# Patient Record
Sex: Male | Born: 1980 | Race: White | Hispanic: No | Marital: Married | Smoking: Never smoker
Health system: Southern US, Community
[De-identification: ages and names within clinical notes are randomized; demographics above are authoritative.]

## PROBLEM LIST (undated history)

## (undated) DIAGNOSIS — E7800 Pure hypercholesterolemia, unspecified: Secondary | ICD-10-CM

---

## 2021-03-08 ENCOUNTER — Other Ambulatory Visit: Payer: Self-pay

## 2021-03-08 ENCOUNTER — Emergency Department (HOSPITAL_BASED_OUTPATIENT_CLINIC_OR_DEPARTMENT_OTHER)
Admission: EM | Admit: 2021-03-08 | Discharge: 2021-03-08 | Disposition: A | Discharge status: Home / Self Care | Payer: BC Managed Care – PPO | Attending: Emergency Medicine | Admitting: Emergency Medicine

## 2021-03-08 ENCOUNTER — Encounter (HOSPITAL_BASED_OUTPATIENT_CLINIC_OR_DEPARTMENT_OTHER): Payer: Self-pay | Admitting: *Deleted

## 2021-03-08 DIAGNOSIS — Y92090 Kitchen in other non-institutional residence as the place of occurrence of the external cause: Secondary | ICD-10-CM | POA: Diagnosis not present

## 2021-03-08 DIAGNOSIS — S61211A Laceration without foreign body of left index finger without damage to nail, initial encounter: Secondary | ICD-10-CM

## 2021-03-08 DIAGNOSIS — W260XXA Contact with knife, initial encounter: Secondary | ICD-10-CM | POA: Diagnosis not present

## 2021-03-08 DIAGNOSIS — S6992XA Unspecified injury of left wrist, hand and finger(s), initial encounter: Secondary | ICD-10-CM | POA: Diagnosis present

## 2021-03-08 HISTORY — DX: Pure hypercholesterolemia, unspecified: E78.00

## 2021-03-08 MED ORDER — ACETAMINOPHEN 500 MG PO TABS
1000.0000 mg | ORAL_TABLET | Freq: Once | ORAL | Status: AC
  Administered 2021-03-08: 1000 mg via ORAL
  Filled 2021-03-08: qty 2

## 2021-03-08 MED ORDER — LIDOCAINE HCL (PF) 1 % IJ SOLN
5.0000 mL | Freq: Once | INTRAMUSCULAR | Status: AC
  Administered 2021-03-08: 5 mL
  Filled 2021-03-08: qty 5

## 2021-03-08 NOTE — ED Triage Notes (Signed)
C/o left index finger  lac by kitchen knife x  20 mins ago

## 2021-03-08 NOTE — Discharge Instructions (Addendum)
I have placed 3 stitches to your left index finger, please have these removed within 7 to 10 days.  You may apply bacitracin or Neosporin to the wound after today.  You may alternate Tylenol or Ibuprofen to help with the pain.

## 2021-03-08 NOTE — ED Notes (Signed)
Pt soaking hand at this time

## 2021-03-08 NOTE — ED Provider Notes (Signed)
[facility] EMERGENCY DEPARTMENT Provider Note   CSN: [id number] Arrival date & time: George  Spencer     History Chief Complaint  Patient presents with  . Extremity Laceration    George Spencer is a 40 y.o. male.  40 y.o male with no PMH presents to the ED with a chief complaint of left index finger laceration x 1 hour ago.  Patient reports he was cutting with a kitchen knife when he suddenly slipped the tip of his left index finger.,  He immediately washed this.  Applied a dry bandage to it along with wrapped it with gauze.  Reports bleeding was controlled at the time.  Last tetanus immunization was about 1 year ago.  There is no other injury or complaint.  The history is provided by the patient and the spouse.       Past Medical History:  Diagnosis Date  . Hypercholesteremia     There are no problems to display for this patient.   History reviewed. No pertinent surgical history.     No family history on file.  Social History   Tobacco Use  . Smoking status: Never Smoker  Substance Use Topics  . Alcohol use: Yes    Home Medications Prior to Admission medications   Not on File    Allergies    Aspirin and Nitazoxanide  Review of Systems   Review of Systems  Constitutional: Negative for fever.  Skin: Positive for wound.    Physical Exam Updated Vital Signs BP (!) 145/98 (BP Location: Left Arm)   Pulse 89   Temp 98.7 F (37.1 C) (Oral)   Resp 18   Ht 5\' 8"  (1.727 m)   Wt 65.8 kg   SpO2 98%   BMI 22.05 kg/m   Physical Exam Vitals and nursing note reviewed.  Constitutional:      Appearance: Normal appearance.  HENT:     Head: Normocephalic and atraumatic.     Nose: Nose normal.     Mouth/Throat:     Mouth: Mucous membranes are moist.  Cardiovascular:     Rate and Rhythm: Normal rate.  Pulmonary:     Effort: Pulmonary effort is normal.  Abdominal:     General: Abdomen is flat.  Musculoskeletal:     Left hand: Laceration  and tenderness present. No swelling, deformity or bony tenderness. Normal range of motion. Normal strength. Normal sensation. There is no disruption of two-point discrimination. Normal capillary refill. Normal pulse.     Cervical back: Normal range of motion and neck supple.     Comments: 1 cm laceration to the distal aspect of the left index finger.  Pulses present, pain with flexion and extension, actively bleeding.  Skin:    General: Skin is warm and dry.     Findings: Erythema present.  Neurological:     Mental Status: He is alert and oriented to person, place, and time.     ED Results / Procedures / Treatments   Labs (all labs ordered are listed, but only abnormal results are displayed) Labs Reviewed - No data to display  EKG None  Radiology No results found.  Procedures . Laceration Repair  Date/Time: 03/08/2021 8:14 PM Performed by: 03/10/2021, PA-C Authorized by: Claude Manges, PA-C   Consent:    Consent obtained:  Verbal   Consent given by:  Patient   Risks discussed:  Infection and pain   Alternatives discussed:  No treatment Anesthesia:    Anesthesia method:  Local infiltration  Local anesthetic:  Lidocaine 1% w/o epi Laceration details:    Location:  Finger   Finger location:  L index finger   Length (cm):  1   Depth (mm):  1 Exploration:    Hemostasis achieved with:  Direct pressure Treatment:    Area cleansed with:  Saline   Amount of cleaning:  Extensive Skin repair:    Repair method:  Sutures   Suture size:  4-0   Suture material:  Prolene   Suture technique:  Simple interrupted   Number of sutures:  3 Approximation:    Approximation:  Close Repair type:    Repair type:  Simple Post-procedure details:    Dressing:  Bulky dressing   Procedure completion:  Tolerated well, no immediate complications     Medications Ordered in ED Medications  lidocaine (PF) (XYLOCAINE) 1 % injection 5 mL (5 mLs Infiltration Given by Other George 1948)   acetaminophen (TYLENOL) tablet 1,000 mg (1,000 mg Oral Given George 1947)    ED Course  I have reviewed the triage vital signs and the nursing notes.  Pertinent labs & imaging results that were available during my care of the patient were reviewed by me and considered in my medical decision making (see chart for details).    MDM Rules/Calculators/A&P     Patient here for left index laceration status post cutting ham 45 minutes prior to arrival into the ED.  Actively bleeding during the time of the evaluation.  There is more pain with flexion along with palpation of the DIP on the left index finger.  No damage to the nail appreciated, laceration is about 1 cm.  Tetanus immunization is up-to-date.  Lidocaine was used to numb the area, 3 stitches were applied, finger was placed in a bulky dressing.  He was given Tylenol to help with symptomatic control.  He is to follow-up with PCP in order to have stitches removed within 7 to 10 days.  Returns precautions discussed at length, patient stable for discharge.  Portions of this note were generated with Scientist, clinical (histocompatibility and immunogenetics). Dictation errors may occur despite best attempts at proofreading.  Final Clinical Impression(s) / ED Diagnoses Final diagnoses:  Laceration of left index finger without foreign body without damage to nail, initial encounter    Rx / DC Orders ED Discharge Orders    None       Claude Manges, Cordelia Poche 03/08/21 2019    Charlynne Pander, MD 03/08/21 2118

## 2021-08-26 ENCOUNTER — Emergency Department (HOSPITAL_COMMUNITY)
Admission: EM | Admit: 2021-08-26 | Discharge: 2021-08-26 | Disposition: A | Discharge status: Home / Self Care | Payer: BC Managed Care – PPO | Attending: Emergency Medicine | Admitting: Emergency Medicine

## 2021-08-26 ENCOUNTER — Encounter (HOSPITAL_COMMUNITY): Payer: Self-pay | Admitting: Emergency Medicine

## 2021-08-26 ENCOUNTER — Emergency Department (HOSPITAL_BASED_OUTPATIENT_CLINIC_OR_DEPARTMENT_OTHER): Payer: BC Managed Care – PPO

## 2021-08-26 ENCOUNTER — Other Ambulatory Visit: Payer: Self-pay

## 2021-08-26 DIAGNOSIS — M79661 Pain in right lower leg: Secondary | ICD-10-CM | POA: Diagnosis not present

## 2021-08-26 DIAGNOSIS — R0989 Other specified symptoms and signs involving the circulatory and respiratory systems: Secondary | ICD-10-CM

## 2021-08-26 DIAGNOSIS — M79604 Pain in right leg: Secondary | ICD-10-CM | POA: Diagnosis present

## 2021-08-26 NOTE — ED Provider Notes (Addendum)
[facility] EMERGENCY DEPARTMENT Provider Note   CSN: [id number] Arrival date & time: 08/26/21  1231     History Chief Complaint  Patient presents with   right leg pain   vein symptom    George Spencer is a 40 y.o. male.  Patient indicates in past few months, sense of noticing veins in right leg being more prominent at times, and intermittent with pain to inside of right lower leg. Symptoms occur at rest, mild, intermittent, without specific exacerbating or alleviating factors. No claudication. No associated numbness/weakness. No swelling or redness to leg. No hx dvt or pe. No chest pain or sob. No fevers.   The history is provided by the patient and medical records.      Past Medical History:  Diagnosis Date   Hypercholesteremia     There are no problems to display for this patient.   History reviewed. No pertinent surgical history.     History reviewed. No pertinent family history.  Social History   Tobacco Use   Smoking status: Never   Smokeless tobacco: Never  Substance Use Topics   Alcohol use: Yes   Drug use: Not Currently    Home Medications Prior to Admission medications   Not on File    Allergies    Aspirin and Nitazoxanide  Review of Systems   Review of Systems  Constitutional:  Negative for fever.  HENT:  Negative for sore throat.   Eyes:  Negative for redness.  Respiratory:  Negative for shortness of breath.   Cardiovascular:  Negative for chest pain.  Gastrointestinal:  Negative for abdominal pain.  Genitourinary:  Negative for flank pain.  Musculoskeletal:  Negative for back pain.  Skin:  Negative for rash.  Neurological:  Negative for weakness, numbness and headaches.  Hematological:  Does not bruise/bleed easily.  Psychiatric/Behavioral:  Negative for confusion.    Physical Exam Updated Vital Signs BP 137/84 (BP Location: Left Arm)   Pulse 75   Temp 99.1 F (37.3 C) (Oral)   Resp 17   Ht 1.727 m (5\' 8" )    Wt 66.7 kg   SpO2 99%   BMI 22.35 kg/m   Physical Exam Vitals and nursing note reviewed.  Constitutional:      Appearance: Normal appearance. He is well-developed.  HENT:     Head: Atraumatic.     Nose: Nose normal.     Mouth/Throat:     Mouth: Mucous membranes are moist.  Eyes:     General: No scleral icterus.    Conjunctiva/sclera: Conjunctivae normal.  Neck:     Trachea: No tracheal deviation.  Cardiovascular:     Rate and Rhythm: Normal rate.     Pulses: Normal pulses.  Pulmonary:     Effort: Pulmonary effort is normal. No accessory muscle usage or respiratory distress.  Abdominal:     General: There is no distension.  Genitourinary:    Comments: No cva tenderness. Musculoskeletal:        General: No swelling or tenderness.     Cervical back: Neck supple.     Right lower leg: No edema.     Comments: RLE of normal appearance, color, and warmth. No swelling noted. No mass felt. No focal bony tenderness. Distal pulses palp. No abnormal appearing veins or varicosities noted.   Skin:    General: Skin is warm and dry.     Findings: No rash.  Neurological:     Mental Status: He is alert.  Comments: Alert, speech clear. RLE motor/sens grossly intact.   Psychiatric:        Mood and Affect: Mood normal.    ED Results / Procedures / Treatments   Labs (all labs ordered are listed, but only abnormal results are displayed) Labs Reviewed - No data to display  EKG None  Radiology VAS [place] LOWER EXTREMITY VENOUS (DVT) (ONLY [facility] & [facility])  Result Date: 08/26/2021  Lower Venous DVT Study Patient Name:  George Spencer  Date of Exam:   08/26/2021 Medical Rec #: [id number]          Accession #:    [id number] Date of Birth: 28-Apr-1981          Patient Gender: M Patient Age:   69 years Exam Location:  [facility] Procedure:      VAS [place] LOWER EXTREMITY VENOUS (DVT) Referring Phys: Honor Loh --------------------------------------------------------------------------------   Indications: RLE vein pain status post eating. Patient also noticed changes in LE vein size for approximately 6 months.  Comparison Study: No prior study on file Performing Technologist: Sherren Kerns RVS  Examination Guidelines: A complete evaluation includes B-mode imaging, spectral Doppler, color Doppler, and power Doppler as needed of all accessible portions of each vessel. Bilateral testing is considered an integral part of a complete examination. Limited examinations for reoccurring indications may be performed as noted. The reflux portion of the exam is performed with the patient in reverse Trendelenburg.  +---------+---------------+---------+-----------+----------+--------------+ RIGHT    CompressibilityPhasicitySpontaneityPropertiesThrombus Aging +---------+---------------+---------+-----------+----------+--------------+ CFV      Full           Yes      Yes                                 +---------+---------------+---------+-----------+----------+--------------+ SFJ      Full                                                        +---------+---------------+---------+-----------+----------+--------------+ FV Prox  Full                                                        +---------+---------------+---------+-----------+----------+--------------+ FV Mid   Full                                                        +---------+---------------+---------+-----------+----------+--------------+ FV DistalFull                                                        +---------+---------------+---------+-----------+----------+--------------+ PFV      Full                                                        +---------+---------------+---------+-----------+----------+--------------+  POP      Full           Yes      Yes                                 +---------+---------------+---------+-----------+----------+--------------+ PTV      Full                                                         +---------+---------------+---------+-----------+----------+--------------+ PERO     Full                                                        +---------+---------------+---------+-----------+----------+--------------+   +----+---------------+---------+-----------+----------+--------------+ LEFTCompressibilityPhasicitySpontaneityPropertiesThrombus Aging +----+---------------+---------+-----------+----------+--------------+ CFV Full           Yes      Yes                                 +----+---------------+---------+-----------+----------+--------------+     Summary: RIGHT: - There is no evidence of deep vein thrombosis in the lower extremity.  LEFT: - No evidence of common femoral vein obstruction.  *See table(s) above for measurements and observations.    Preliminary     Procedures Procedures   Medications Ordered in ED Medications - No data to display  ED Course  I have reviewed the triage vital signs and the nursing notes.  Pertinent labs & imaging results that were available during my care of the patient were reviewed by me and considered in my medical decision making (see chart for details).    MDM Rules/Calculators/A&P                          Vascular u/s ordered at triage - no dvt.   Reviewed nursing notes and prior charts for additional history.  Recent lab workup ~ 2 weeks ago appears negative.   Vitals normal. Exam of leg appears normal.   Pt currently appears stable for d/c.   Rec pcp f/u.    Final Clinical Impression(s) / ED Diagnoses Final diagnoses:  Pain of right lower extremity  Vein symptom    Rx / DC Orders ED Discharge Orders     None           Cathren Laine, MD 08/26/21 1629

## 2021-08-26 NOTE — ED Triage Notes (Signed)
Pt reports "veins changing in size" intermittently x 6 months.  Reports "pain in veins" over the past few weeks that is worse at night after eating dinner- especially vein in R leg.  Also reports intermittent tingling to fingertips.  States BP has been up and down (100s/ and 130s/).

## 2021-08-26 NOTE — Discharge Instructions (Signed)
It was our pleasure to provide your ER care today - we hope that you feel better.  Today, your blood pressure looks fine, and your veins appear normal (and there is no blood clot on the ultrasound).  Follow up with primary care doctor in the next few weeks.   Return to ER if worse, new symptoms, fevers, chest pain, trouble breathing, or other concern.

## 2021-08-26 NOTE — ED Notes (Signed)
Patient transported to Ultrasound 

## 2021-08-26 NOTE — ED Provider Notes (Signed)
Emergency Medicine Provider Triage Evaluation Note  George Spencer , a 40 y.o. male  was evaluated in triage.  Pt complains of right medial ankle pain which he describes as his "veins hurting."  He has been having intermittent chest pains, palpitations, and hyper tensive episodes recently although he is chest pain-free now.  He is being followed by cardiologist had a normal echocardiogram and just turned in a Holter monitor this past week.  He is scheduled to follow up with a electrophysiologist next week.  Currently rates his pain a 4/10 in severity.  Review of Systems  Positive:  Negative: See above   Physical Exam  BP 137/84 (BP Location: Left Arm)   Pulse 75   Temp 99.1 F (37.3 C) (Oral)   Resp 17   Ht 5\' 8"  (1.727 m)   Wt 66.7 kg   SpO2 99%   BMI 22.35 kg/m  Gen:   Awake, no distress   Resp:  Normal effort  MSK:   Moves extremities without difficulty  Other:    Medical Decision Making  Medically screening exam initiated at 1:35 PM.  Appropriate orders placed.  was informed that the remainder of the evaluation will be completed by another provider, this initial triage assessment does not replace that evaluation, and the importance of remaining in the ED until their evaluation is complete.     Florence Canner [place], PA-C 08/26/21 1336    10/26/21, MD 08/27/21 1348

## 2021-08-26 NOTE — Progress Notes (Signed)
VASCULAR LAB    Right lower extremity venous duplex has been performed.  See CV proc for preliminary results.  Messaged results to Dr. Denton Lank via secure chat.  Ainsley Deakins, RVT 08/26/2021, 3:37 PM

## 2021-12-22 ENCOUNTER — Emergency Department (HOSPITAL_BASED_OUTPATIENT_CLINIC_OR_DEPARTMENT_OTHER)
Admission: EM | Admit: 2021-12-22 | Discharge: 2021-12-22 | Disposition: A | Discharge status: Home / Self Care | Payer: BC Managed Care – PPO | Attending: Emergency Medicine | Admitting: Emergency Medicine

## 2021-12-22 ENCOUNTER — Encounter (HOSPITAL_BASED_OUTPATIENT_CLINIC_OR_DEPARTMENT_OTHER): Payer: Self-pay | Admitting: *Deleted

## 2021-12-22 ENCOUNTER — Other Ambulatory Visit: Payer: Self-pay

## 2021-12-22 ENCOUNTER — Emergency Department (HOSPITAL_BASED_OUTPATIENT_CLINIC_OR_DEPARTMENT_OTHER): Payer: BC Managed Care – PPO

## 2021-12-22 DIAGNOSIS — H9201 Otalgia, right ear: Secondary | ICD-10-CM | POA: Insufficient documentation

## 2021-12-22 DIAGNOSIS — B349 Viral infection, unspecified: Secondary | ICD-10-CM | POA: Diagnosis not present

## 2021-12-22 DIAGNOSIS — R519 Headache, unspecified: Secondary | ICD-10-CM

## 2021-12-22 DIAGNOSIS — Z20822 Contact with and (suspected) exposure to covid-19: Secondary | ICD-10-CM | POA: Insufficient documentation

## 2021-12-22 LAB — BASIC METABOLIC PANEL
Anion gap: 10 (ref 5–15)
BUN: 19 mg/dL (ref 6–20)
CO2: 27 mmol/L (ref 22–32)
Calcium: 9.6 mg/dL (ref 8.9–10.3)
Chloride: 101 mmol/L (ref 98–111)
Creatinine, Ser: 0.99 mg/dL (ref 0.61–1.24)
GFR, Estimated: >60 mL/min (ref >60)
Glucose, Bld: 102 mg/dL — ABNORMAL HIGH (ref 70–99)
Potassium: 3.9 mmol/L (ref 3.5–5.1)
Sodium: 138 mmol/L (ref 135–145)

## 2021-12-22 LAB — CBC WITH DIFFERENTIAL/PLATELET
Abs Immature Granulocytes: 0.02 10*3/uL (ref 0.00–0.07)
Basophils Absolute: 0 10*3/uL (ref 0.0–0.1)
Basophils Relative: 1 %
Eosinophils Absolute: 0.2 10*3/uL (ref 0.0–0.5)
Eosinophils Relative: 2 %
HCT: 42.4 % (ref 39.0–52.0)
Hemoglobin: 14.6 g/dL (ref 13.0–17.0)
Immature Granulocytes: 0 %
Lymphocytes Relative: 33 %
Lymphs Abs: 2.2 10*3/uL (ref 0.7–4.0)
MCH: 29.9 pg (ref 26.0–34.0)
MCHC: 34.4 g/dL (ref 30.0–36.0)
MCV: 86.7 fL (ref 80.0–100.0)
Monocytes Absolute: 0.5 10*3/uL (ref 0.1–1.0)
Monocytes Relative: 8 %
Neutro Abs: 3.8 10*3/uL (ref 1.7–7.7)
Neutrophils Relative %: 56 %
Platelets: 214 10*3/uL (ref 150–400)
RBC: 4.89 MIL/uL (ref 4.22–5.81)
RDW: 12.2 % (ref 11.5–15.5)
WBC: 6.7 10*3/uL (ref 4.0–10.5)
nRBC: 0 % (ref 0.0–0.2)

## 2021-12-22 LAB — RESP PANEL BY RT-PCR (FLU A&B, COVID) ARPGX2
Influenza A by PCR: NEGATIVE
Influenza B by PCR: NEGATIVE
SARS Coronavirus 2 by RT PCR: NEGATIVE

## 2021-12-22 MED ORDER — LACTATED RINGERS IV BOLUS
1000.0000 mL | Freq: Once | INTRAVENOUS | Status: AC
  Administered 2021-12-22: 1000 mL via INTRAVENOUS

## 2021-12-22 MED ORDER — IBUPROFEN 800 MG PO TABS
800.0000 mg | ORAL_TABLET | Freq: Once | ORAL | Status: AC
  Administered 2021-12-22: 800 mg via ORAL
  Filled 2021-12-22: qty 1

## 2021-12-22 MED ORDER — KETOROLAC TROMETHAMINE 30 MG/ML IJ SOLN
15.0000 mg | Freq: Once | INTRAMUSCULAR | Status: AC
  Administered 2021-12-22: 15 mg via INTRAVENOUS
  Filled 2021-12-22: qty 1

## 2021-12-22 NOTE — ED Provider Notes (Signed)
[facility] EMERGENCY DEPARTMENT Provider Note    CSN: [id number] Arrival date & time: 12/22/21 1746  History Chief Complaint  Patient presents with   Generalized Body Aches    George Spencer is a 41 y.o. male with no significant PMH reports he was seen at [facility] yesterday for R ear pain and eye drainage. Diagnosed with OM, OE and conjunctivitis and given Rx for Augmentin, ear drops and eye drops. Today after lunch he began to have chills, body aches and R sided headache. He checked his temp at home but it was not elevated. He reports he had meningitis as a child and the headache felt similar. No neck stiffness, blurry vision, vomiting. No cough or SOB.    Home Medications Prior to Admission medications   Not on File     Allergies    Aspirin and Nitazoxanide   Review of Systems   Review of Systems Please see HPI for pertinent positives and negatives  Physical Exam BP 132/85 (BP Location: Right Arm)    Pulse 72    Temp 98 F (36.7 C) (Oral)    Resp 16    Ht 5\' 8"  (1.727 m)    Wt 65.3 kg    SpO2 100%    BMI 21.90 kg/m   Physical Exam Vitals and nursing note reviewed.  Constitutional:      Appearance: Normal appearance.  HENT:     Head: Normocephalic and atraumatic.     Right Ear: Tympanic membrane normal.     Left Ear: Tympanic membrane normal.     Nose: Nose normal.     Mouth/Throat:     Mouth: Mucous membranes are moist.  Eyes:     General:        Right eye: No discharge.        Left eye: No discharge.     Extraocular Movements: Extraocular movements intact.     Conjunctiva/sclera: Conjunctivae normal.  Cardiovascular:     Rate and Rhythm: Normal rate.  Pulmonary:     Effort: Pulmonary effort is normal.     Breath sounds: Normal breath sounds.  Abdominal:     General: Abdomen is flat.     Palpations: Abdomen is soft.     Tenderness: There is no abdominal tenderness.  Musculoskeletal:        General: No swelling. Normal range of motion.      Cervical back: Normal range of motion and neck supple. No rigidity or tenderness.  Skin:    General: Skin is warm and dry.  Neurological:     General: No focal deficit present.     Mental Status: He is alert.  Psychiatric:        Mood and Affect: Mood normal.    ED Results / Procedures / Treatments   EKG None  Procedures Procedures  Medications Ordered in the ED Medications  ibuprofen (ADVIL) tablet 800 mg (800 mg Oral Given 12/22/21 1906)  lactated ringers bolus 1,000 mL (1,000 mLs Intravenous New Bag/Given 12/22/21 2030)  ketorolac (TORADOL) 30 MG/ML injection 15 mg (15 mg Intravenous Given 12/22/21 2021)    Initial Impression and Plan  Patient with symptoms of viral syndrome. He does not have signs of OM today. He is concerned about meningitis but he does not have any physical exam signs of meningismus.Will check Covid/Flu swab, Motrin for headache (he has ASA allergy but reports he has tolerate Motrin before).   ED Course   Clinical Course as of 12/22/21 2130  Fri Dec 22, 2021  1920 Covid/Flu are negative.  [CS]  1952 Patient reports no improvement in his R sided headache with Motrin. He does not have any meningismus but he is still concerned about the headache which is new for him. Will check labs with head CT. Toradol for pain.  [CS]  2041 CT images and results reviewed, no acute findings. Prominent sulci of unclear significance not related to today's complaints.  [CS]  2053 CBC is normal.  [CS]  2128 BMP is normal. Patient reports improved headache. Reviewed the CT results with the patient. He would like referral to Neurology regarding the signs of atrophy, although again I do not feel that is causing his symptoms today.  [CS]    Clinical Course User Index [CS] Pollyann Savoy, MD     MDM Rules/Calculators/A&P Medical Decision Making Problems Addressed: Acute nonintractable headache, unspecified headache type: acute illness or injury that poses a threat to life or  bodily functions Viral syndrome: acute illness or injury that poses a threat to life or bodily functions  Amount and/or Complexity of Data Reviewed Labs: ordered. Decision-making details documented in ED Course. Radiology: ordered and independent interpretation performed. Decision-making details documented in ED Course.  Risk Prescription drug management. Decision regarding hospitalization.    Final Clinical Impression(s) / ED Diagnoses Final diagnoses:  Viral syndrome  Acute nonintractable headache, unspecified headache type    Rx / DC Orders ED Discharge Orders     None        Pollyann Savoy, MD 12/22/21 2130

## 2021-12-22 NOTE — ED Triage Notes (Addendum)
C/o chills , body aches, cough , h/a,right ear pain , x 1 day

## 2021-12-22 NOTE — ED Notes (Signed)
Patient transported to CT 

## 2022-05-08 IMAGING — CT CT HEAD W/O CM
3 series · 16 of 47 positions shown, 19 images · non-contrast
Comparison: None.

CLINICAL DATA: Headaches



[Series 2: head wo · axial · 0.44mm/px · z∈[-125,+20]mm · 10 of 35 slices shown, 13 images]
[im 3/35  brain]
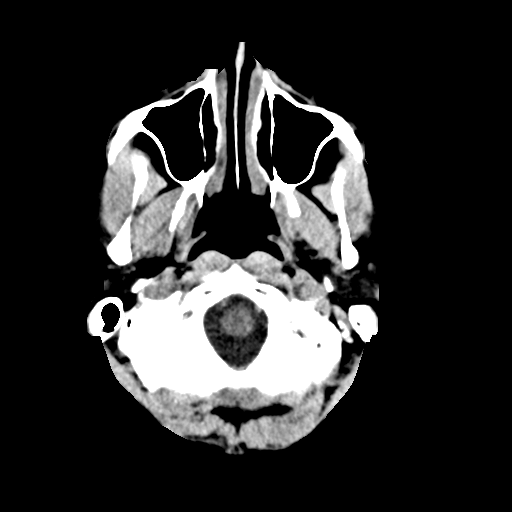
[im 3/35  bone]
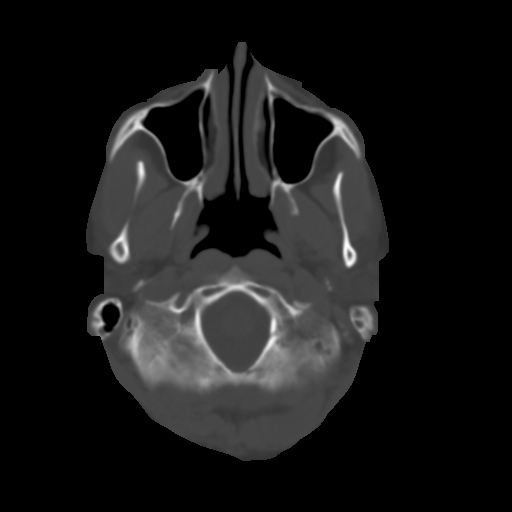
[im 6/35  brain]
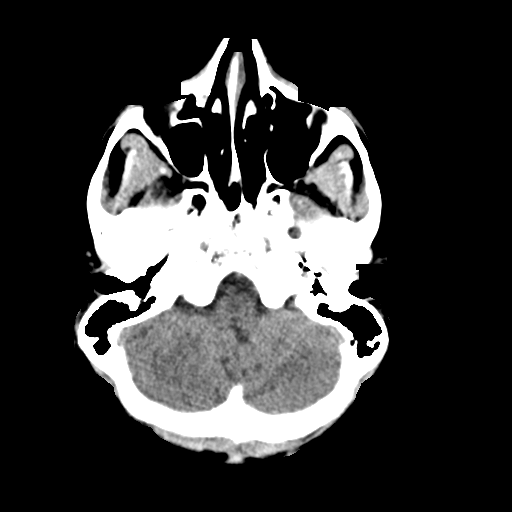
[im 10/35  brain]
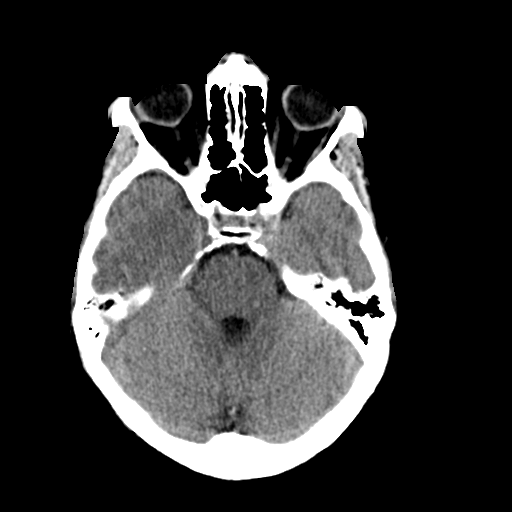
[im 12/35  brain]
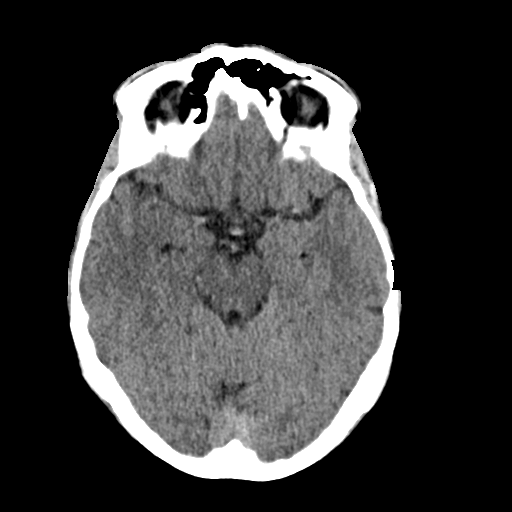
[im 16/35  brain]
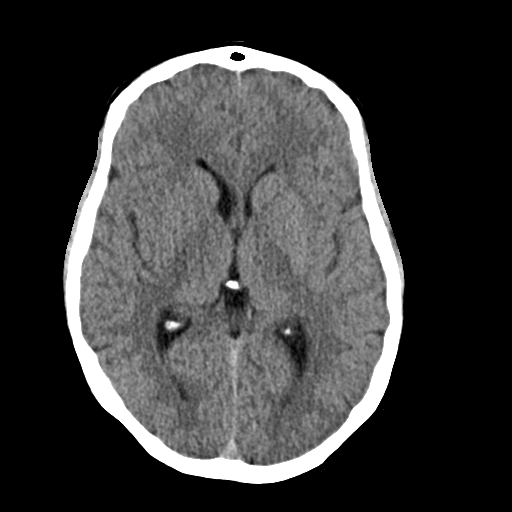
[im 16/35  bone]
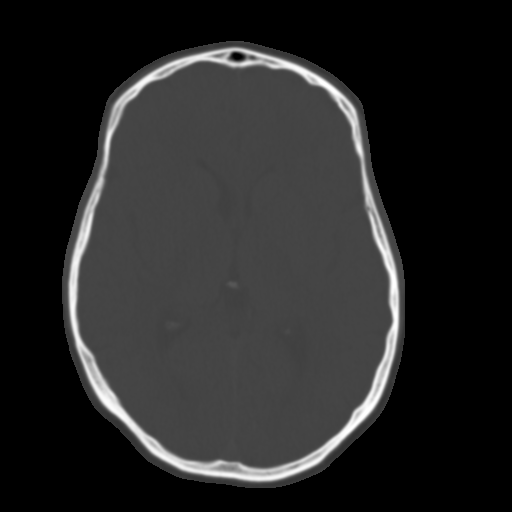
[im 19/35  brain]
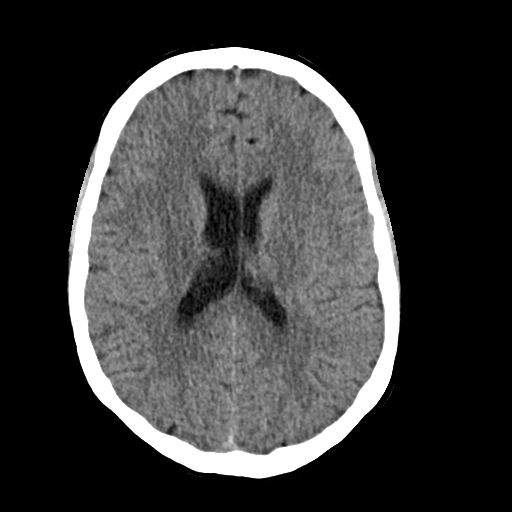
[im 23/35  brain]
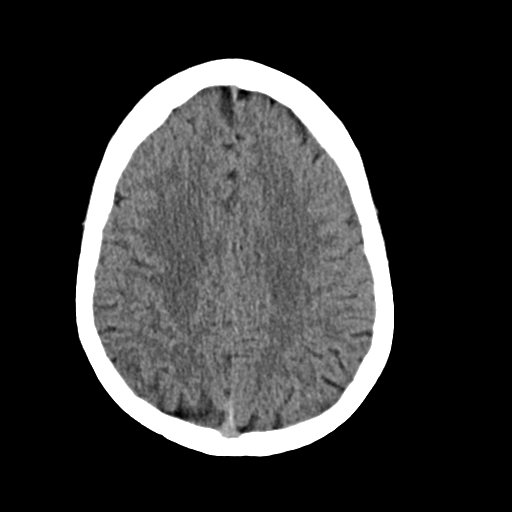
[im 26/35  brain]
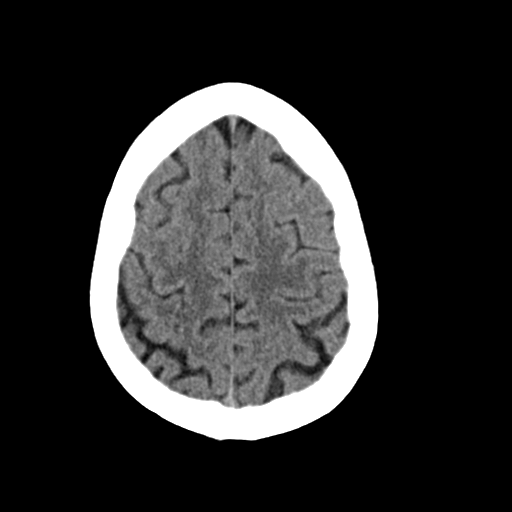
[im 29/35  brain]
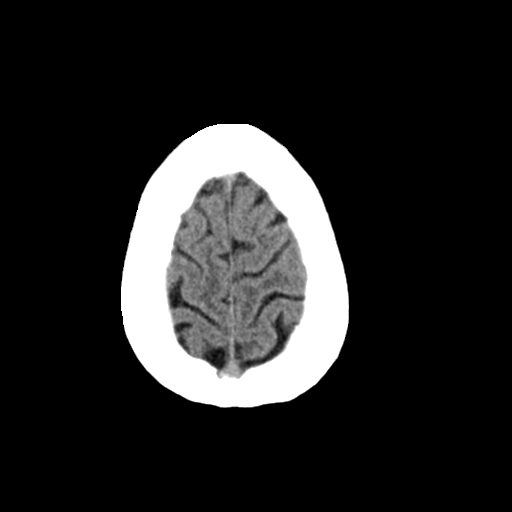
[im 29/35  bone]
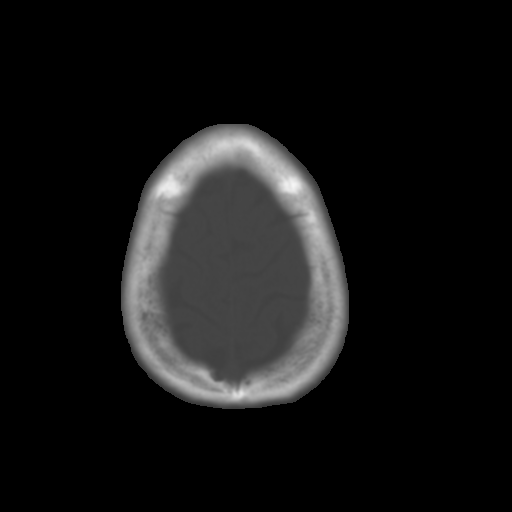
[im 32/35  brain]
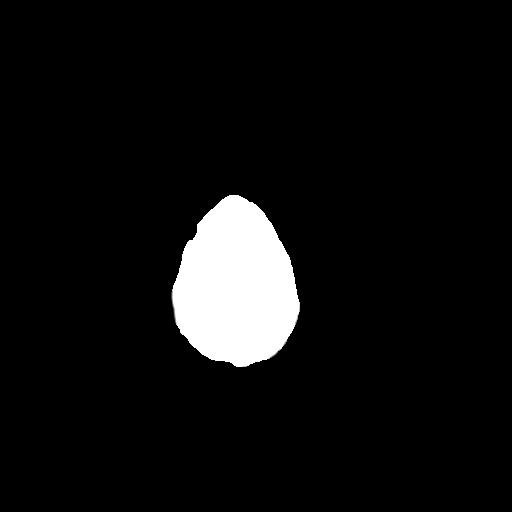

[Series 4: coronal soft · coronal · 0.40mm/px · 3 of 72 slices shown]
[im 24/72  brain]
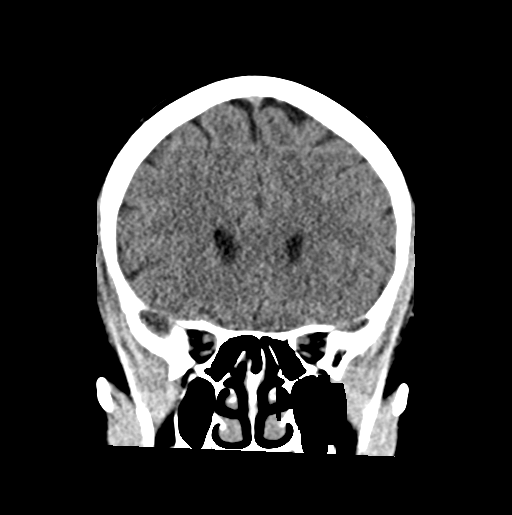
[im 32/72  brain]
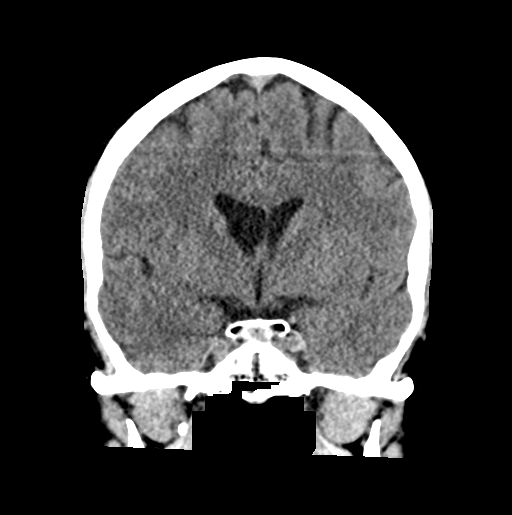
[im 40/72  brain]
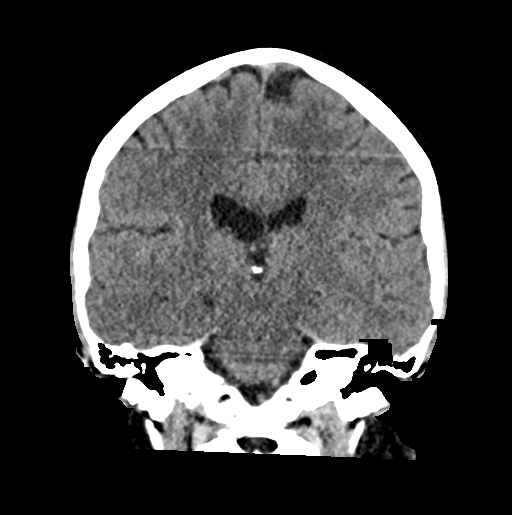

[Series 5: sag soft · sagittal · 0.38mm/px · 3 of 57 slices shown]
[im 19/57  brain]
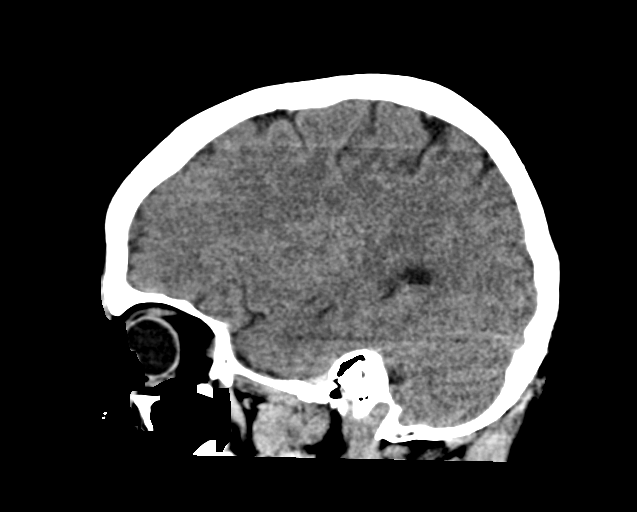
[im 29/57  brain]
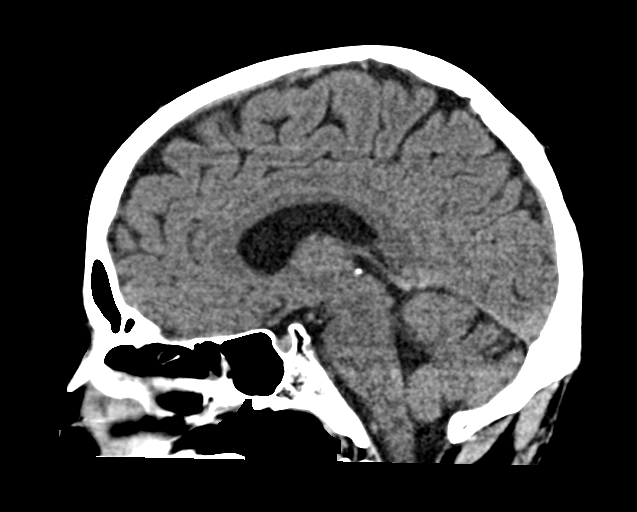
[im 38/57  brain]
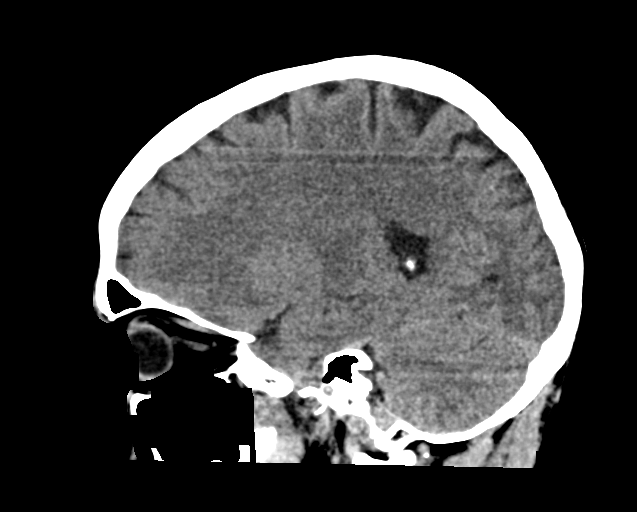

[16 of 47 positions shown; findings below may reference images not displayed]

FINDINGS: Brain: No acute intracranial findings are seen. Slight asymmetry in
the size of lateral ventricles may be normal variation. There is no
shift of midline structures. Cortical sulci are prominent.
Gray-white differentiation is preserved.

Vascular: Unremarkable.

Skull: Unremarkable.

Sinuses/Orbits: Unremarkable.

Other: None
IMPRESSION: No acute intracranial findings are seen in noncontrast CT brain.
Cortical sulci are prominent suggesting atrophy.

## 2022-05-25 ENCOUNTER — Emergency Department (HOSPITAL_BASED_OUTPATIENT_CLINIC_OR_DEPARTMENT_OTHER)
Admission: EM | Admit: 2022-05-25 | Discharge: 2022-05-25 | Disposition: A | Discharge status: Home / Self Care | Payer: BC Managed Care – PPO | Attending: Emergency Medicine | Admitting: Emergency Medicine

## 2022-05-25 ENCOUNTER — Other Ambulatory Visit: Payer: Self-pay

## 2022-05-25 ENCOUNTER — Encounter (HOSPITAL_BASED_OUTPATIENT_CLINIC_OR_DEPARTMENT_OTHER): Payer: Self-pay | Admitting: Emergency Medicine

## 2022-05-25 DIAGNOSIS — R21 Rash and other nonspecific skin eruption: Secondary | ICD-10-CM | POA: Diagnosis present

## 2022-05-25 DIAGNOSIS — T7840XA Allergy, unspecified, initial encounter: Secondary | ICD-10-CM | POA: Insufficient documentation

## 2022-05-25 MED ORDER — EPINEPHRINE 0.3 MG/0.3ML IJ SOAJ: 0.3000 mg | INTRAMUSCULAR | 3 refills | Status: AC | PRN

## 2022-05-25 NOTE — ED Triage Notes (Signed)
Patient presents to ED via POV. Patient reports being stung by a bee 3x to left leg. Carries an epi pen but did not use it due to it being expired. Denies SOB or difficulty breathing. Hives noted to bilateral forearms.

## 2022-05-25 NOTE — ED Provider Notes (Signed)
[facility] EMERGENCY DEPARTMENT Provider Note   CSN: [id number] Arrival date & time: 05/25/22  1834     History  Chief Complaint  Patient presents with   Insect Bite    Edon Hoadley is a 41 y.o. male presenting to ED with concern for bee sting and rash.  He reports this occurred around 6 PM this evening, he was stung on the left lower leg by a yellow jacket.  He immediately began developing hives across his legs and lower abdomens.  He said his throat felt sore and scratchy.  Since arriving in the ED all of his symptoms have completely resolved on their own.  He did take 1 Zyrtec prior to arrival.  He has been prescribed EpiPen in the past but reports his EpiPen was expired and he did not use it  HPI     Home Medications Prior to Admission medications   Medication Sig Start Date End Date Taking? Authorizing Provider  EPINEPHrine 0.3 mg/0.3 mL IJ SOAJ injection Inject 0.3 mg into the muscle as needed for anaphylaxis. 05/25/22  Yes Terald Sleeper, MD      Allergies    Aspirin and Nitazoxanide    Review of Systems   Review of Systems  Physical Exam Updated Vital Signs BP (!) 118/94 (BP Location: Right Arm)   Pulse 99   Temp 98.9 F (37.2 C) (Oral)   Resp 18   SpO2 100%  Physical Exam Constitutional:      General: He is not in acute distress. HENT:     Head: Normocephalic and atraumatic.  Eyes:     Conjunctiva/sclera: Conjunctivae normal.     Pupils: Pupils are equal, round, and reactive to light.  Neck:     Comments: Oropharynx non-erythematous.  No tonsillar swelling or exudate.  No uvular deviation.  No drooling. No brawny edema. No stridor. Voice is not muffled. Cardiovascular:     Rate and Rhythm: Normal rate and regular rhythm.  Pulmonary:     Effort: Pulmonary effort is normal. No respiratory distress.  Abdominal:     General: There is no distension.     Tenderness: There is no abdominal tenderness.  Skin:    General: Skin is warm  and dry.  Neurological:     General: No focal deficit present.     Mental Status: He is alert and oriented to person, place, and time. Mental status is at baseline.  Psychiatric:        Mood and Affect: Mood normal.        Behavior: Behavior normal.     ED Results / Procedures / Treatments   Labs (all labs ordered are listed, but only abnormal results are displayed) Labs Reviewed - No data to display  EKG None  Radiology No results found.  Procedures Procedures    Medications Ordered in ED Medications - No data to display  ED Course/ Medical Decision Making/ A&P Clinical Course as of 05/25/22 2312  Fri May 25, 2022  2237 Patient stable now 4 hours after the sting, asymptomatic.  Stable for discharge [MT]    Clinical Course User Index [MT] Sumit Branham, Kermit Balo, MD                           Medical Decision Making Risk Prescription drug management.   Patient is here for evaluation after a suspected allergic reaction to hymenoptera venom, bee sting.  His history could be consistent with anaphylaxis  as there is both skin and possible airway involvement.  However symptoms have completely resolved by the time he was evaluated by myself.  He is not requiring epinephrine, Benadryl, or further medications at this time.  We will continue to observe him for a short period of time, to ensure he has no rebound reaction, no represcribe his EpiPen at home.  He verbalized understanding and agreement with the plan.  His airway exam is reassuring.  No evidence of shock.        Final Clinical Impression(s) / ED Diagnoses Final diagnoses:  Allergic reaction, initial encounter    Rx / DC Orders ED Discharge Orders          Ordered    EPINEPHrine 0.3 mg/0.3 mL IJ SOAJ injection  As needed        05/25/22 2237              Terald Sleeper, MD 05/25/22 2312

## 2022-05-25 NOTE — ED Notes (Signed)
Patient seen before triage after registration,  No know allergy to bee venom, epi pen expired, BBS CTA, no stridor noted, able to speaking completes sentences, HR 128, SpO2 100%, RR 25-30
